# Patient Record
Sex: Female | Born: 1965 | Race: Black or African American | Hispanic: No | Smoking: Former smoker
Health system: Southern US, Community
[De-identification: ages and names within clinical notes are randomized; demographics above are authoritative.]

## PROBLEM LIST (undated history)

## (undated) ENCOUNTER — Emergency Department: Admission: EM | Payer: BC Managed Care – PPO | Source: Home / Self Care

## (undated) DIAGNOSIS — E7800 Pure hypercholesterolemia, unspecified: Secondary | ICD-10-CM

## (undated) DIAGNOSIS — I10 Essential (primary) hypertension: Secondary | ICD-10-CM

---

## 1988-10-23 HISTORY — PX: TUBAL LIGATION: SHX77

## 1998-10-23 HISTORY — PX: KNEE SURGERY: SHX244

## 2001-10-05 ENCOUNTER — Emergency Department (HOSPITAL_COMMUNITY): Admission: EM | Admit: 2001-10-05 | Discharge: 2001-10-05 | Payer: Self-pay | Admitting: *Deleted

## 2002-12-17 ENCOUNTER — Emergency Department (HOSPITAL_COMMUNITY): Admission: EM | Admit: 2002-12-17 | Discharge: 2002-12-17 | Payer: Self-pay | Admitting: Emergency Medicine

## 2004-05-28 ENCOUNTER — Other Ambulatory Visit: Payer: Self-pay

## 2005-08-11 ENCOUNTER — Ambulatory Visit: Payer: Self-pay | Admitting: Family Medicine

## 2005-08-24 ENCOUNTER — Ambulatory Visit: Payer: Self-pay | Admitting: Family Medicine

## 2007-02-11 IMAGING — MG UNKNOWN MG STUDY
1 series · 4 of 4 positions shown · non-contrast
Comparison: none

REASON FOR EXAM: Screening mammography
COMMENTS:

[R MLO · right · 4 of 4 slices shown]
[im 1/4]
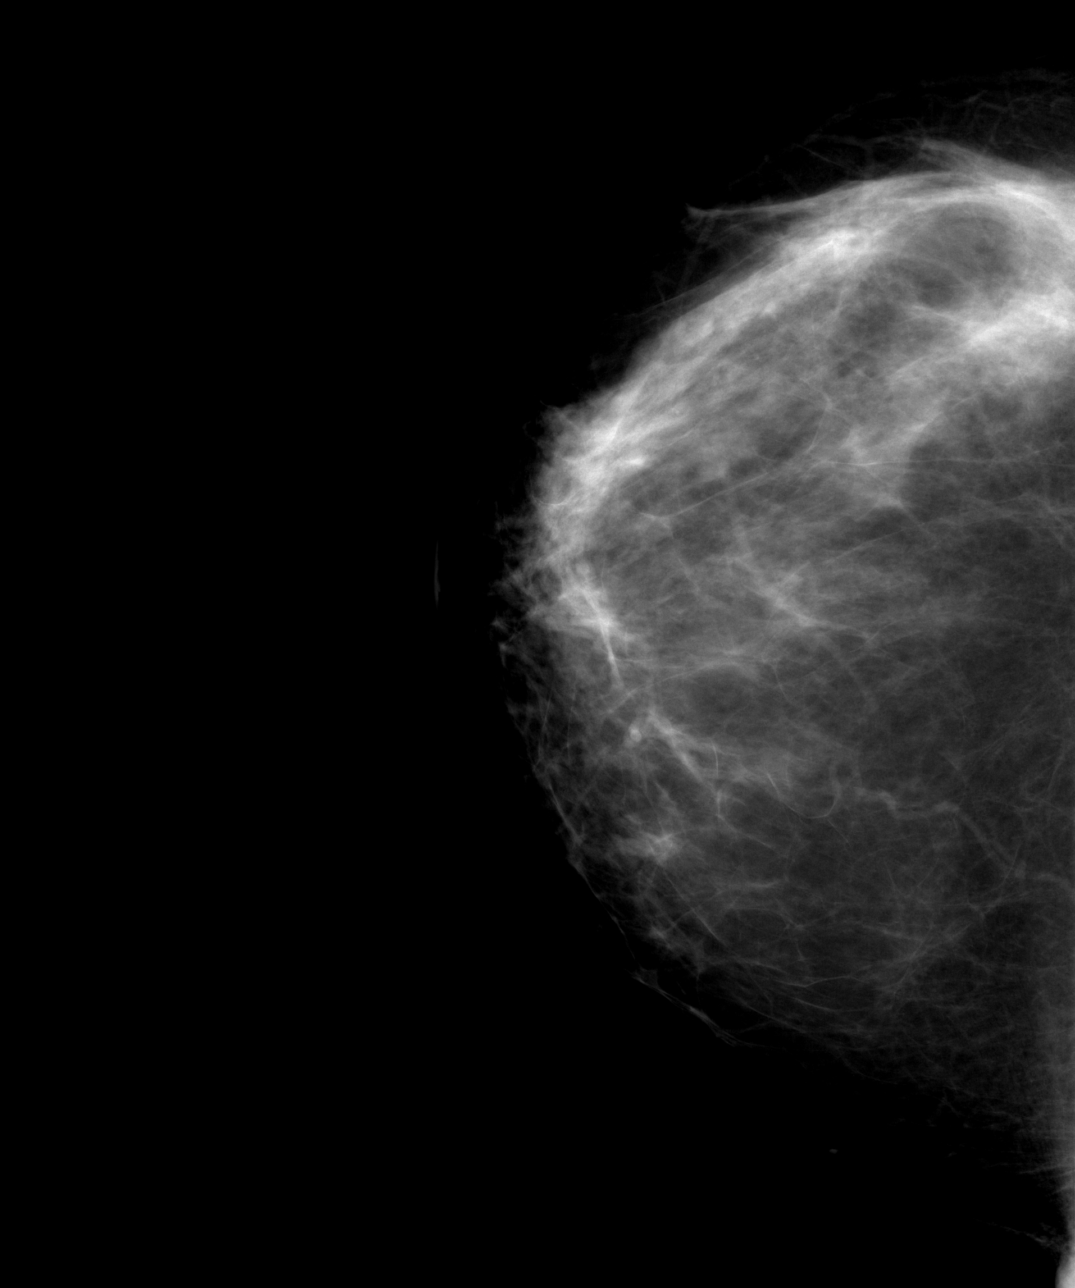
[im 2/4]
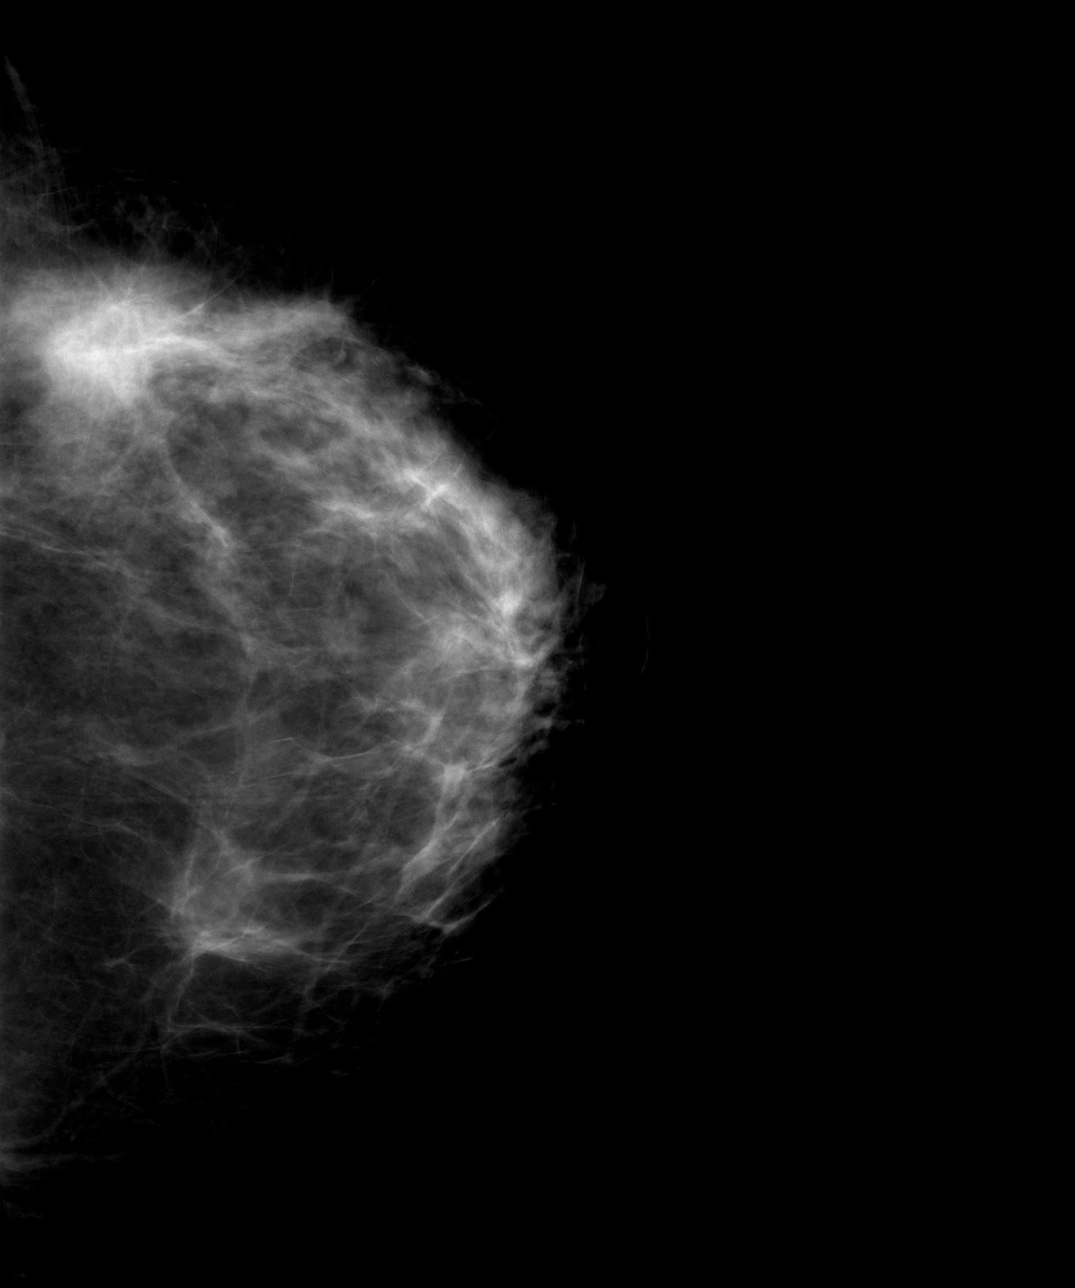
[im 3/4]
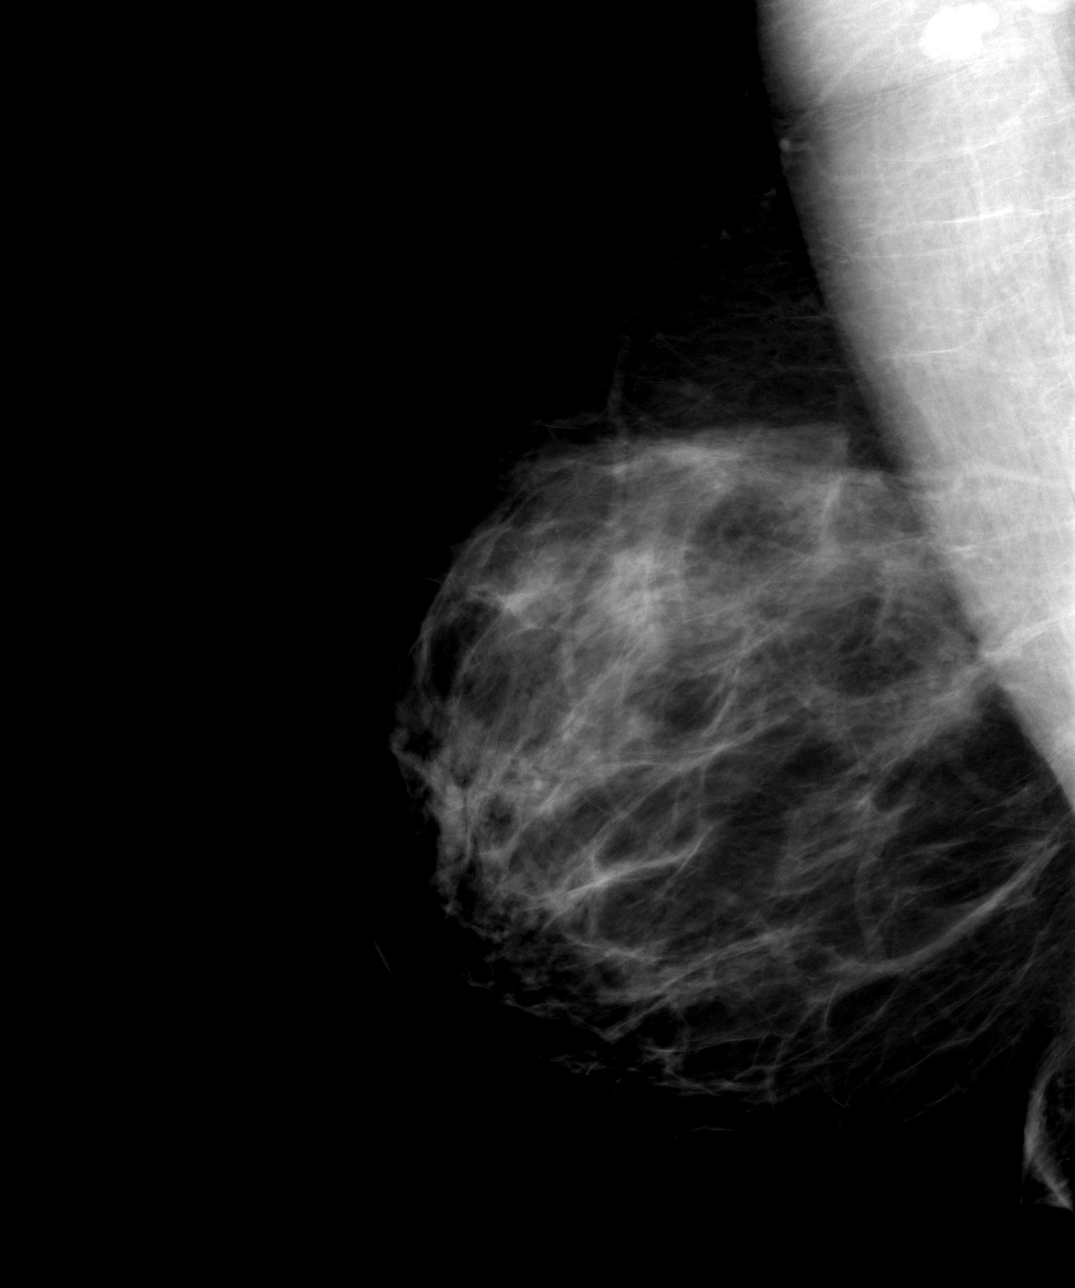
[im 4/4]
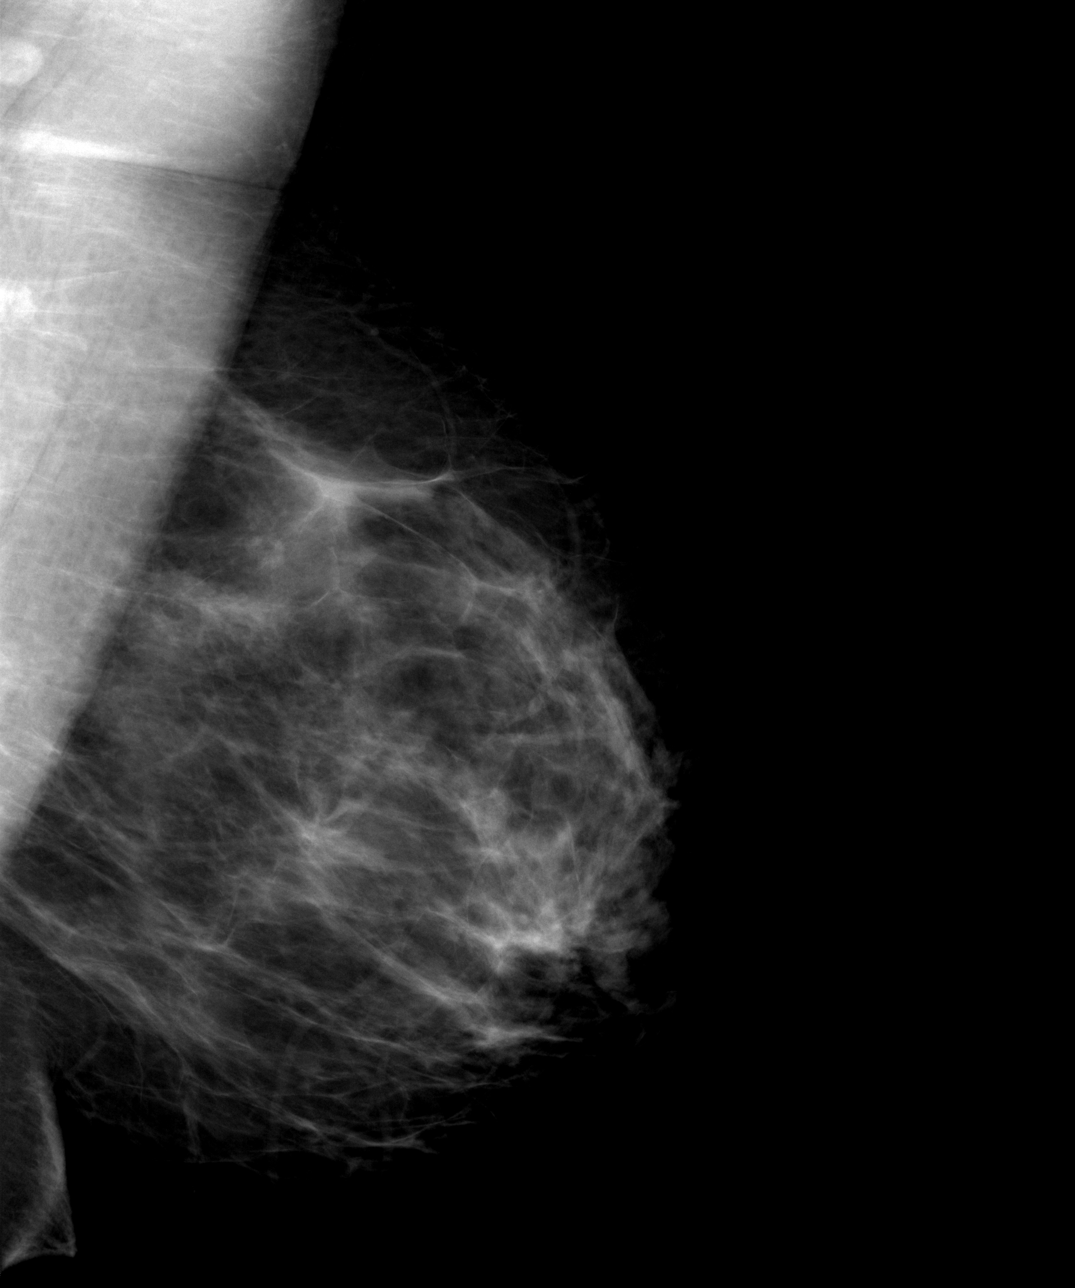

[4 of 4 positions shown; findings below may reference images not displayed]

PROCEDURE:     MAM - MAM DGTL SCREENING MAMMO W/CAD  - August 11, 2005  [DATE]

RESULT:        Comparison is made to prior study dated  02/12/04.

The breasts demonstrate a heterogenous fibronodular parenchymal pattern.  An
area of asymmetric density projects within the superior lateral portion of
the LEFT breast.  Further evaluation with magnification compression imaging
is recommended.  No further radiographic evidence to suggest malignancy is
appreciated.
IMPRESSION: 1.     Asymmetric density, LEFT breast.  Additional imaging recommended.
2.     BI-RADS:  Category 0- Needs Additional Imaging Evaluation.

Thank you for this opportunity to contribute to the care of your patient.

A NEGATIVE MAMMOGRAM REPORT DOES NOT PRECLUDE BIOPSY OR OTHER EVALUATION OF
A CLINICALLY PALPABLE OR OTHERWISE SUSPICIOUS MASS OR LESION.   BREAST

## 2007-06-04 ENCOUNTER — Ambulatory Visit: Payer: Self-pay | Admitting: Family Medicine

## 2008-06-16 ENCOUNTER — Ambulatory Visit: Payer: Self-pay

## 2013-09-08 ENCOUNTER — Encounter (HOSPITAL_COMMUNITY): Payer: Self-pay | Admitting: Emergency Medicine

## 2013-09-08 ENCOUNTER — Emergency Department (HOSPITAL_COMMUNITY)
Admission: EM | Admit: 2013-09-08 | Discharge: 2013-09-08 | Disposition: A | Discharge status: Home / Self Care | Payer: BC Managed Care – PPO | Attending: Emergency Medicine | Admitting: Emergency Medicine

## 2013-09-08 DIAGNOSIS — R112 Nausea with vomiting, unspecified: Secondary | ICD-10-CM | POA: Insufficient documentation

## 2013-09-08 DIAGNOSIS — K209 Esophagitis, unspecified without bleeding: Secondary | ICD-10-CM | POA: Insufficient documentation

## 2013-09-08 DIAGNOSIS — R197 Diarrhea, unspecified: Secondary | ICD-10-CM | POA: Insufficient documentation

## 2013-09-08 DIAGNOSIS — R358 Other polyuria: Secondary | ICD-10-CM | POA: Insufficient documentation

## 2013-09-08 DIAGNOSIS — Z87891 Personal history of nicotine dependence: Secondary | ICD-10-CM | POA: Insufficient documentation

## 2013-09-08 DIAGNOSIS — R350 Frequency of micturition: Secondary | ICD-10-CM | POA: Insufficient documentation

## 2013-09-08 DIAGNOSIS — R3589 Other polyuria: Secondary | ICD-10-CM | POA: Insufficient documentation

## 2013-09-08 DIAGNOSIS — Z3202 Encounter for pregnancy test, result negative: Secondary | ICD-10-CM | POA: Insufficient documentation

## 2013-09-08 DIAGNOSIS — Z79899 Other long term (current) drug therapy: Secondary | ICD-10-CM | POA: Insufficient documentation

## 2013-09-08 DIAGNOSIS — K219 Gastro-esophageal reflux disease without esophagitis: Secondary | ICD-10-CM | POA: Insufficient documentation

## 2013-09-08 LAB — URINALYSIS, ROUTINE W REFLEX MICROSCOPIC
Glucose, UA: NEGATIVE mg/dL
Hgb urine dipstick: NEGATIVE
Nitrite: NEGATIVE
Protein, ur: NEGATIVE mg/dL
Specific Gravity, Urine: >1.03 — ABNORMAL HIGH (ref 1.005–1.030)
Urobilinogen, UA: 0.2 mg/dL (ref 0.0–1.0)
pH: 5.5 (ref 5.0–8.0)

## 2013-09-08 LAB — COMPREHENSIVE METABOLIC PANEL
ALT: 13 U/L (ref 0–35)
Alkaline Phosphatase: 61 U/L (ref 39–117)
CO2: 27 mEq/L (ref 19–32)
GFR calc Af Amer: 79 mL/min — ABNORMAL LOW (ref >90)
Glucose, Bld: 109 mg/dL — ABNORMAL HIGH (ref 70–99)
Potassium: 4 mEq/L (ref 3.5–5.1)
Sodium: 134 mEq/L — ABNORMAL LOW (ref 135–145)
Total Protein: 7.2 g/dL (ref 6.0–8.3)

## 2013-09-08 LAB — URINE MICROSCOPIC-ADD ON

## 2013-09-08 LAB — CBC
HCT: 38.1 % (ref 36.0–46.0)
MCHC: 34.4 g/dL (ref 30.0–36.0)
Platelets: 250 10*3/uL (ref 150–400)
RDW: 13.1 % (ref 11.5–15.5)

## 2013-09-08 LAB — POCT PREGNANCY, URINE: Preg Test, Ur: NEGATIVE

## 2013-09-08 MED ORDER — ONDANSETRON 4 MG PO TBDP: 4.0000 mg | ORAL_TABLET | Freq: Three times a day (TID) | ORAL | Status: DC | PRN

## 2013-09-08 MED ORDER — GI COCKTAIL ~~LOC~~
30.0000 mL | Freq: Once | ORAL | Status: AC
  Administered 2013-09-08: 30 mL via ORAL
  Filled 2013-09-08: qty 30

## 2013-09-08 MED ORDER — METRONIDAZOLE 500 MG PO TABS: 2000.0000 mg | ORAL_TABLET | Freq: Once | ORAL | Status: DC

## 2013-09-08 MED ORDER — OMEPRAZOLE 20 MG PO CPDR: 20.0000 mg | DELAYED_RELEASE_CAPSULE | Freq: Two times a day (BID) | ORAL | Status: DC

## 2013-09-08 NOTE — ED Notes (Signed)
Pt states upper abdominal pain with diarrhea began last night. Pt states she is coughing up thick phlegm, clear and watery at times, per pt.

## 2013-09-08 NOTE — ED Provider Notes (Signed)
CSN: [id number]     Arrival date & time 09/08/13  1610 History   First MD Initiated Contact with Patient 09/08/13 2015     Chief Complaint  Patient presents with  . Abdominal Pain  . Diarrhea    HPI  Patient presents for 24 hours of symptoms with nausea and some diarrhea last night. She was retching and vomited several times. She was sore and her epigastrium. This was a bit sore this morning as well. No additional diarrhea. When she eats today he passes but she states it makes her comfortable and points to her low substernal area as the area of discomfort. No more regurgitation. She did eat a sausage chicken biscuit today  History reviewed. No pertinent past medical history. Past Surgical History  Procedure Laterality Date  . Knee surgery    . Tubal ligaton     History reviewed. No pertinent family history. History  Substance Use Topics  . Smoking status: Former Games developer  . Smokeless tobacco: Not on file  . Alcohol Use: Yes     Comment: Occ   OB History   Grav Para Term Preterm Abortions TAB SAB Ect Mult Living                 Review of Systems  Constitutional: Negative for fever, chills, diaphoresis, appetite change and fatigue.  HENT: Negative for mouth sores, sore throat and trouble swallowing.   Eyes: Negative for visual disturbance.  Respiratory: Negative for cough, chest tightness, shortness of breath and wheezing.   Cardiovascular: Negative for chest pain.  Gastrointestinal: Positive for nausea, vomiting and abdominal pain. Negative for diarrhea and abdominal distention.  Endocrine: Positive for polyuria. Negative for polydipsia and polyphagia.  Genitourinary: Positive for frequency. Negative for dysuria, hematuria and vaginal discharge.  Musculoskeletal: Negative for gait problem.  Skin: Negative for color change, pallor and rash.  Neurological: Negative for dizziness, syncope, light-headedness and headaches.  Hematological: Does not bruise/bleed easily.   Psychiatric/Behavioral: Negative for behavioral problems and confusion.    Allergies  Review of patient's allergies indicates no known allergies.  Home Medications   Current Outpatient Rx  Name  Route  Sig  Dispense  Refill  . metroNIDAZOLE (FLAGYL) 500 MG tablet   Oral   Take 4 tablets (2,000 mg total) by mouth once.   4 tablet   0   . omeprazole (PRILOSEC) 20 MG capsule   Oral   Take 1 capsule (20 mg total) by mouth 2 (two) times daily.   60 capsule   0   . ondansetron (ZOFRAN ODT) 4 MG disintegrating tablet   Oral   Take 1 tablet (4 mg total) by mouth every 8 (eight) hours as needed for nausea.   20 tablet   0    BP 146/82  Pulse 79  Temp(Src) 97.7 F (36.5 C) (Oral)  Resp 18  Ht 5\' 10"  (1.778 m)  Wt 250 lb (113.399 kg)  BMI 35.87 kg/m2  SpO2 97%  LMP 08/21/2013 Physical Exam  Constitutional: She is oriented to person, place, and time. She appears well-developed and well-nourished. No distress.  HENT:  Head: Normocephalic.  Eyes: Conjunctivae are normal. Pupils are equal, round, and reactive to light. No scleral icterus.  Neck: Normal range of motion. Neck supple. No thyromegaly present.  Cardiovascular: Normal rate and regular rhythm.  Exam reveals no gallop and no friction rub.   No murmur heard. Pulmonary/Chest: Effort normal and breath sounds normal. No respiratory distress. She has no wheezes. She  has no rales.  Lungs clear. No abnormal breath sounds.  Abdominal: Soft. Bowel sounds are normal. She exhibits no distension. There is no tenderness. There is no rebound.  Tender in the midline epigastrium. No right or left upper quadrant pain. No peritoneal irritation.  Musculoskeletal: Normal range of motion.  Neurological: She is alert and oriented to person, place, and time.  Skin: Skin is warm and dry. No rash noted.  Psychiatric: She has a normal mood and affect. Her behavior is normal.    ED Course  Procedures (including critical care time) Labs  Review Labs Reviewed  URINALYSIS, ROUTINE W REFLEX MICROSCOPIC - Abnormal; Notable for the following:    Specific Gravity, Urine >1.030 (*)    Leukocytes, UA MODERATE (*)    All other components within normal limits  COMPREHENSIVE METABOLIC PANEL - Abnormal; Notable for the following:    Sodium 134 (*)    Glucose, Bld 109 (*)    Albumin 3.4 (*)    GFR calc non Af Amer 68 (*)    GFR calc Af Amer 79 (*)    All other components within normal limits  URINE MICROSCOPIC-ADD ON - Abnormal; Notable for the following:    Squamous Epithelial / LPF MANY (*)    Bacteria, UA MANY (*)    All other components within normal limits  URINE CULTURE  CBC  TROPONIN I  POCT PREGNANCY, URINE   Imaging Review No results found.  EKG Interpretation    Date/Time:  Monday September 08 2013 20:36:15 EST Ventricular Rate:  86 PR Interval:  164 QRS Duration: 78 QT Interval:  362 QTC Calculation: 433 R Axis:   50 Text Interpretation:  Normal sinus rhythm Normal ECG No previous ECGs available            MDM   1. Esophagitis   2. GERD (gastroesophageal reflux disease)    She is able to swallow. I feel confident she is esophageal impaction. This likely is esophagitis or vomiting and retching yesterday. She has a lot of cells in her urine has minimally symptomatic. She does have frequency. Also has Trichomonas. Plan is treatment for Trichomonas await urine culture. Also we will treat her with Pepcid. Zofran recheck with any worsening symptoms. Normal EKG normal troponin less not an acute coronary syndrome. No impaction. She is able to swallow without regurgitation. Plan symptomatic treatment. Proton pump inhibitors.    Roney Marion, MD 09/08/13 [phone]

## 2013-09-08 NOTE — ED Notes (Signed)
Family at bedside. Patient states that she has had some relief form the gagging feeling she had earlier.

## 2013-09-10 LAB — URINE CULTURE: Colony Count: 40000

## 2015-07-22 ENCOUNTER — Encounter: Payer: Self-pay | Admitting: *Deleted

## 2015-08-04 ENCOUNTER — Encounter: Payer: Self-pay | Admitting: General Surgery

## 2015-08-04 ENCOUNTER — Ambulatory Visit (INDEPENDENT_AMBULATORY_CARE_PROVIDER_SITE_OTHER): Payer: BC Managed Care – PPO | Admitting: General Surgery

## 2015-08-04 VITALS — BP 124/66 | HR 72 | Resp 12 | Ht 70.0 in | Wt 263.0 lb

## 2015-08-04 DIAGNOSIS — Z1211 Encounter for screening for malignant neoplasm of colon: Secondary | ICD-10-CM | POA: Diagnosis not present

## 2015-08-04 MED ORDER — POLYETHYLENE GLYCOL 3350 17 GM/SCOOP PO POWD: ORAL | Status: DC

## 2015-08-04 NOTE — Patient Instructions (Addendum)
Colonoscopy A colonoscopy is an exam to look at the entire large intestine (colon). This exam can help find problems such as tumors, polyps, inflammation, and areas of bleeding. The exam takes about 1 hour.  [facility] KNOW ABOUT:   Any allergies you have.  All medicines you are taking, including vitamins, herbs, eye drops, creams, and over-the-counter medicines.  Previous problems you or members of your family have had with the use of anesthetics.  Any blood disorders you have.  Previous surgeries you have had.  Medical conditions you have. RISKS AND COMPLICATIONS  Generally, this is a safe procedure. However, as with any procedure, complications can occur. Possible complications include:  Bleeding.  Tearing or rupture of the colon wall.  Reaction to medicines given during the exam.  Infection (rare). BEFORE THE PROCEDURE   Ask your health care provider about changing or stopping your regular medicines.  You may be prescribed an oral bowel prep. This involves drinking a large amount of medicated liquid, starting the day before your procedure. The liquid will cause you to have multiple loose stools until your stool is almost clear or light green. This cleans out your colon in preparation for the procedure.  Do not eat or drink anything else once you have started the bowel prep, unless your health care provider tells you it is safe to do so.  Arrange for someone to drive you home after the procedure. PROCEDURE   You will be given medicine to help you relax (sedative).  You will lie on your side with your knees bent.  A long, flexible tube with a light and camera on the end (colonoscope) will be inserted through the rectum and into the colon. The camera sends video back to a computer screen as it moves through the colon. The colonoscope also releases carbon dioxide gas to inflate the colon. This helps your health care provider see the area better.  During  the exam, your health care provider may take a small tissue sample (biopsy) to be examined under a microscope if any abnormalities are found.  The exam is finished when the entire colon has been viewed. AFTER THE PROCEDURE   Do not drive for 24 hours after the exam.  You may have a small amount of blood in your stool.  You may pass moderate amounts of gas and have mild abdominal cramping or bloating. This is caused by the gas used to inflate your colon during the exam.  Ask when your test results will be ready and how you will get your results. Make sure you get your test results.   This information is not intended to replace advice given to you by your health care provider. Make sure you discuss any questions you have with your health care provider.   Document Released: 10/06/2000 Document Revised: 07/30/2013 Document Reviewed: 06/16/2013 Elsevier Interactive Patient Education [employer]2016 Elsevier Inc.  Patient [facility].

## 2015-08-04 NOTE — Progress Notes (Signed)
Patient ID: Sandra Garrett, female   DOB: Jun 16, 1966, 49 y.o.   MRN: [id number]  Chief Complaint  Patient presents with  . Colonoscopy    HPI Sandra Garrett is a 49 y.o. female here for an evaluation for colonoscopy. She has not had one prior. Patient states no GI Issues. She moves her bowels every 2 or 3 days. 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 She has 2 children one boy and one girl.  HPI  No past medical history on file.  Past Surgical History  Procedure Laterality Date  . Knee surgery  2000  . Tubal ligation  1990    No family history on file.  Social History Social History  Substance Use Topics  . Smoking status: Former Smoker    Quit date: 10/23/1978  . Smokeless tobacco: None  . Alcohol Use: 0.6 - 1.2 oz/week    1-2 Standard drinks or equivalent per week     Comment: Occ    No Known Allergies  Current Outpatient Prescriptions  Medication Sig Dispense Refill  . Acetaminophen-Caffeine (EXCEDRIN TENSION HEADACHE) 500-65 MG TABS Take by mouth as needed.    . polyethylene glycol powder (GLYCOLAX/MIRALAX) powder 255 grams one bottle for colonoscopy prep 255 g 0   No current facility-administered medications for this visit.    Review of Systems Review of Systems  Constitutional: Negative.   Respiratory: Negative.   Cardiovascular: Negative.   Gastrointestinal: Negative.     Blood pressure 124/66, pulse 72, resp. rate 12, height 5\' 10"  (1.778 m), weight 263 lb (119.296 kg), last menstrual period 07/27/2015.  Physical Exam Physical Exam  Constitutional: She is oriented to person, place, and time. She appears well-developed and well-nourished.  Eyes: Conjunctivae are normal. No scleral icterus.  Cardiovascular: Normal rate, regular rhythm and normal heart sounds.   Pulmonary/Chest: Effort normal and breath sounds normal.  Neurological: She is alert and oriented to person, place, and time.  Skin: Skin is warm and dry.    Data  Reviewed OB/GYN node of 07/22/2015. Fecal occult blood was reported as negative.  Assessment    Candidate for screening colonoscopy.    Plan         Colonoscopy with possible biopsy/polypectomy prn: Information regarding the procedure, including its potential risks and complications (including but not limited to perforation of the bowel, which may require emergency surgery to repair, and bleeding) was verbally given to the patient. Educational information regarding lower intestinal endoscopy was given to the patient. Written instructions for how to complete the bowel prep using Miralax were provided. The importance of drinking ample fluids to avoid dehydration as a result of the prep emphasized.  [facility].   PCP: None Ref: Dr. Arvil ChacoVan Dalen   Earline MayotteByrnett, Jeffrey W 08/05/2015, 7:11 AM

## 2015-08-05 DIAGNOSIS — Z1211 Encounter for screening for malignant neoplasm of colon: Secondary | ICD-10-CM | POA: Insufficient documentation

## 2015-08-05 NOTE — H&P (Signed)
HPI  Sandra Garrett is a 49 y.o. female here for an evaluation for colonoscopy. She has not had one prior. Patient states no GI Issues. She moves her bowels every 2 or 3 days. 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 She has 2 children one boy and one girl.  HPI  No past medical history on file.  Past Surgical History   Procedure  Laterality  Date   .  Knee surgery   2000   .  Tubal ligation   1990    No family history on file.  Social History  Social History   Substance Use Topics   .  Smoking status:  Former Smoker     Quit date:  10/23/1978   .  Smokeless tobacco:  None   .  Alcohol Use:  0.6 - 1.2 oz/week     1-2 Standard drinks or equivalent per week      Comment: Occ    No Known Allergies  Current Outpatient Prescriptions   Medication  Sig  Dispense  Refill   .  Acetaminophen-Caffeine (EXCEDRIN TENSION HEADACHE) 500-65 MG TABS  Take by mouth as needed.     .  polyethylene glycol powder (GLYCOLAX/MIRALAX) powder  255 grams one bottle for colonoscopy prep  255 g  0    No current facility-administered medications for this visit.    Review of Systems  Review of Systems  Constitutional: Negative.  Respiratory: Negative.  Cardiovascular: Negative.  Gastrointestinal: Negative.   Blood pressure 124/66, pulse 72, resp. rate 12, height 5\' 10"  (1.778 m), weight 263 lb (119.296 kg), last menstrual period 07/27/2015.  Physical Exam  Physical Exam  Constitutional: She is oriented to person, place, and time. She appears well-developed and well-nourished.  Eyes: Conjunctivae are normal. No scleral icterus.  Cardiovascular: Normal rate, regular rhythm and normal heart sounds.  Pulmonary/Chest: Effort normal and breath sounds normal.  Neurological: She is alert and oriented to person, place, and time.  Skin: Skin is warm and dry.   Data Reviewed  OB/GYN node of 07/22/2015. Fecal occult blood was reported as negative.  Assessment   Candidate for screening  colonoscopy.   Plan    Colonoscopy with possible biopsy/polypectomy prn: Information regarding the procedure, including its potential risks and complications (including but not limited to perforation of the bowel, which may require emergency surgery to repair, and bleeding) was verbally given to the patient. Educational information regarding lower intestinal endoscopy was given to the patient. Written instructions for how to complete the bowel prep using Miralax were provided. The importance of drinking ample fluids to avoid dehydration as a result of the prep emphasized.  Patient has been [facility].  PCP: None  Ref: Dr. Arvil ChacoVan Dalen  Earline MayotteByrnett, Roe Wilner W  08/05/2015, 7:11 AM

## 2015-08-17 ENCOUNTER — Ambulatory Visit
Admission: RE | Admit: 2015-08-17 | Discharge: 2015-08-17 | Disposition: A | Discharge status: Home / Self Care | Payer: BC Managed Care – PPO | Source: Ambulatory Visit | Attending: General Surgery | Admitting: General Surgery

## 2015-08-17 ENCOUNTER — Encounter
Admission: RE | Disposition: A | Discharge status: Home / Self Care | Payer: Self-pay | Source: Ambulatory Visit | Attending: General Surgery

## 2015-08-17 ENCOUNTER — Encounter: Payer: Self-pay | Admitting: Anesthesiology

## 2015-08-17 ENCOUNTER — Ambulatory Visit: Payer: BC Managed Care – PPO | Admitting: Anesthesiology

## 2015-08-17 DIAGNOSIS — Z1211 Encounter for screening for malignant neoplasm of colon: Secondary | ICD-10-CM | POA: Diagnosis not present

## 2015-08-17 DIAGNOSIS — K621 Rectal polyp: Secondary | ICD-10-CM | POA: Diagnosis not present

## 2015-08-17 DIAGNOSIS — Z87891 Personal history of nicotine dependence: Secondary | ICD-10-CM | POA: Diagnosis not present

## 2015-08-17 DIAGNOSIS — E119 Type 2 diabetes mellitus without complications: Secondary | ICD-10-CM | POA: Diagnosis not present

## 2015-08-17 HISTORY — PX: COLONOSCOPY WITH PROPOFOL: SHX5780

## 2015-08-17 LAB — POCT PREGNANCY, URINE: Preg Test, Ur: NEGATIVE

## 2015-08-17 SURGERY — COLONOSCOPY WITH PROPOFOL
Anesthesia: General

## 2015-08-17 MED ORDER — SODIUM CHLORIDE 0.9 % IV SOLN
INTRAVENOUS | Status: DC
  Administered 2015-08-17: 14:00:00 via INTRAVENOUS

## 2015-08-17 MED ORDER — SODIUM CHLORIDE 0.9 % IV SOLN
INTRAVENOUS | Status: DC | PRN
  Administered 2015-08-17: 14:00:00 via INTRAVENOUS

## 2015-08-17 MED ORDER — MIDAZOLAM HCL 2 MG/2ML IJ SOLN
INTRAMUSCULAR | Status: DC | PRN
  Administered 2015-08-17: 1 mg via INTRAVENOUS

## 2015-08-17 MED ORDER — PROPOFOL 500 MG/50ML IV EMUL
INTRAVENOUS | Status: DC | PRN
  Administered 2015-08-17: 100 ug/kg/min via INTRAVENOUS

## 2015-08-17 MED ORDER — FENTANYL CITRATE (PF) 100 MCG/2ML IJ SOLN
INTRAMUSCULAR | Status: DC | PRN
  Administered 2015-08-17: 50 ug via INTRAVENOUS

## 2015-08-17 MED ORDER — SODIUM CHLORIDE 0.9 % IV SOLN: INTRAVENOUS | Status: DC

## 2015-08-17 NOTE — Anesthesia Postprocedure Evaluation (Signed)
  Anesthesia Post-op Note  Patient: Sandra Garrett  Procedure(s) Performed: Procedure(s): COLONOSCOPY WITH PROPOFOL (N/A)  Anesthesia type:General  Patient location: PACU  Post pain: Pain level controlled  Post assessment: Post-op Vital signs reviewed, Patient's Cardiovascular Status Stable, Respiratory Function Stable, Patent Airway and No signs of Nausea or vomiting  Post vital signs: Reviewed and stable  Last Vitals:  Filed Vitals:   08/17/15 1450  BP: 127/82  Pulse: 68  Temp:   Resp: 10    Level of consciousness: awake, alert  and patient cooperative  Complications: No apparent anesthesia complications

## 2015-08-17 NOTE — Transfer of Care (Signed)
Immediate Anesthesia Transfer of Care Note  Patient: Sandra Garrett  Procedure(s) Performed: Procedure(s): COLONOSCOPY WITH PROPOFOL (N/A)  Patient Location: PACU  Anesthesia Type:General  Level of Consciousness: awake and alert   Airway & Oxygen Therapy: Patient Spontanous Breathing  Post-op Assessment: Report given to RN  Post vital signs: stable  Last Vitals:  Filed Vitals:   08/17/15 1417  BP: 112/66  Pulse: 77  Temp: 37 C  Resp: 20    Complications: No apparent anesthesia complications

## 2015-08-17 NOTE — Op Note (Signed)
[facility]enter[facility] Patient Name: Sandra Garrett Procedure Date: 08/17/2015 1:54 PM MRN: [id number] Account #: [id number] Date of Birth: 08-26-1966 Admit Type: Outpatient Age: 49 [facility] ROOM 4 Gender: Female Note Status: Finalized Procedure:         Colonoscopy Indications:       Screening for colorectal malignant neoplasm Providers:         Earline MayotteJeffrey W. Nicey Krah, MD Referring MD:      Rona Ravensobert W. Van Dalen, MD (Referring MD) Medicines:         Monitored Anesthesia Care Complications:     No immediate complications. Procedure:         Pre-Anesthesia Assessment:                    - The risks and benefits of the procedure and the sedation                     options and risks were discussed with the patient. All                     questions were answered and informed consent was obtained.                    - Prior to the procedure, a History and Physical was                     performed, and patient medications, allergies and                     sensitivities were reviewed. The patient's tolerance of                     previous anesthesia was reviewed.                    After obtaining informed consent, the colonoscope was                     passed under direct vision. Throughout the procedure, the                     patient's blood pressure, pulse, and oxygen saturations                     were monitored continuously. The Colonoscope was                     introduced through the anus and advanced to the the cecum,                     identified by appendiceal orifice and ileocecal valve. The                     colonoscopy was performed without difficulty. The patient                     tolerated the procedure well. The quality of the bowel                     preparation was excellent. Findings:      A 5 mm polyp was found in the rectum. The polyp was sessile. This was       biopsied with a cold forceps for histology.      The  retroflexed view of the  distal rectum and anal verge was normal and       showed no anal or rectal abnormalities. Impression:        - One 5 mm polyp in the rectum. Biopsied.                    - The distal rectum and anal verge are normal on                     retroflexion view. Recommendation:    - Telephone endoscopist for pathology results in 1 week. Procedure Code(s): --- Professional ---                    [id number], Colonoscopy, flexible; with biopsy, single or                     multiple Diagnosis Code(s): --- Professional ---                    Z12.11, Encounter for screening for malignant neoplasm of                     colon                    K62.1, Rectal polyp CPT copyright 2014 American Medical Association. All rights reserved. The codes documented in this report are preliminary and upon coder review may  be revised to meet current compliance requirements. Earline Mayotte, MD 08/17/2015 2:16:18 PM This report has been signed electronically. Number of Addenda: 0 Note Initiated On: 08/17/2015 1:54 PM Scope Withdrawal Time: 0 hours 8 minutes 44 seconds  Total Procedure Duration: 0 hours 14 minutes 24 seconds       [facility]

## 2015-08-17 NOTE — Anesthesia Preprocedure Evaluation (Signed)
Anesthesia Evaluation  Patient identified by MRN, date of birth, ID band Patient awake    Reviewed: Allergy & Precautions, H&P , NPO status , Patient's Chart, lab work & pertinent test results  History of Anesthesia Complications Negative for: history of anesthetic complications  Airway Mallampati: II  TM Distance: >3 FB Neck ROM: full    Dental  (+) Poor Dentition, Chipped, Caps   Pulmonary neg COPD, former smoker,    Pulmonary exam normal breath sounds clear to auscultation       Cardiovascular Exercise Tolerance: Good (-) angina(-) Past MI and (-) DOE negative cardio ROS Normal cardiovascular exam Rhythm:regular Rate:Normal     Neuro/Psych negative neurological ROS  negative psych ROS   GI/Hepatic negative GI ROS, Neg liver ROS,   Endo/Other  diabetes  Renal/GU negative Renal ROS  negative genitourinary   Musculoskeletal negative musculoskeletal ROS (+)   Abdominal   Peds negative pediatric ROS (+)  Hematology negative hematology ROS (+)   Anesthesia Other Findings History reviewed. No pertinent past medical history.  Past Surgical History:   KNEE SURGERY                                     2000         TUBAL LIGATION                                   1990        BMI    Body Mass Index   37.73 kg/m 2      Reproductive/Obstetrics negative OB ROS                             Anesthesia Physical Anesthesia Plan  ASA: III  Anesthesia Plan: General   Post-op Pain Management:    Induction:   Airway Management Planned:   Additional Equipment:   Intra-op Plan:   Post-operative Plan:   Informed Consent: I have reviewed the patients History and Physical, chart, labs and discussed the procedure including the risks, benefits and alternatives for the proposed anesthesia with the patient or authorized representative who has indicated his/her understanding and acceptance.    Dental Advisory Given  Plan Discussed with: Anesthesiologist, CRNA and Surgeon  Anesthesia Plan Comments:         Anesthesia Quick Evaluation

## 2015-08-18 NOTE — H&P (Signed)
Sandra Garrett [id number] 11-26-1965     HPI: Candidate for screening colonoscopy  No prescriptions prior to admission   No Known Allergies History reviewed. No pertinent past medical history. Past Surgical History  Procedure Laterality Date  . Knee surgery  2000  . Tubal ligation  1990   Social History   Social History  . Marital Status: Married    Spouse Name: N/A  . Number of Children: N/A  . Years of Education: N/A   Occupational History  . Not on file.   Social History Main Topics  . Smoking status: Former Smoker    Quit date: 10/23/1978  . Smokeless tobacco: Not on file  . Alcohol Use: 0.6 - 1.2 oz/week    1-2 Standard drinks or equivalent per week     Comment: Occ  . Drug Use: No  . Sexual Activity: Not on file   Other Topics Concern  . Not on file   Social History Narrative   Social History   Social History Narrative     ROS: Negative.     PE: HEENT: Negative. Lungs: Clear. Cardio: RR. Earline MayotteByrnett, Jeffrey W 08/18/2015   Assessment/Plan:  Proceed with planned endoscopy.

## 2015-08-19 ENCOUNTER — Encounter: Payer: Self-pay | Admitting: General Surgery

## 2015-08-19 LAB — SURGICAL PATHOLOGY

## 2015-11-10 ENCOUNTER — Emergency Department
Admission: EM | Admit: 2015-11-10 | Discharge: 2015-11-11 | Disposition: A | Discharge status: Home / Self Care | Payer: BC Managed Care – PPO | Attending: Emergency Medicine | Admitting: Emergency Medicine

## 2015-11-10 ENCOUNTER — Encounter: Payer: Self-pay | Admitting: *Deleted

## 2015-11-10 DIAGNOSIS — R6883 Chills (without fever): Secondary | ICD-10-CM | POA: Diagnosis not present

## 2015-11-10 DIAGNOSIS — Z87891 Personal history of nicotine dependence: Secondary | ICD-10-CM | POA: Diagnosis not present

## 2015-11-10 DIAGNOSIS — R1110 Vomiting, unspecified: Secondary | ICD-10-CM

## 2015-11-10 DIAGNOSIS — R197 Diarrhea, unspecified: Secondary | ICD-10-CM | POA: Diagnosis not present

## 2015-11-10 DIAGNOSIS — R112 Nausea with vomiting, unspecified: Secondary | ICD-10-CM | POA: Diagnosis not present

## 2015-11-10 LAB — COMPREHENSIVE METABOLIC PANEL
ALBUMIN: 3.8 g/dL (ref 3.5–5.0)
ALK PHOS: 55 U/L (ref 38–126)
ALT: 20 U/L (ref 14–54)
AST: 21 U/L (ref 15–41)
Anion gap: 8 (ref 5–15)
BILIRUBIN TOTAL: 1 mg/dL (ref 0.3–1.2)
BUN: 13 mg/dL (ref 6–20)
CALCIUM: 9 mg/dL (ref 8.9–10.3)
CO2: 24 mmol/L (ref 22–32)
Chloride: 102 mmol/L (ref 101–111)
Creatinine, Ser: 0.87 mg/dL (ref 0.44–1.00)
GFR calc Af Amer: >60 mL/min (ref >60)
GFR calc non Af Amer: >60 mL/min (ref >60)
Glucose, Bld: 132 mg/dL — ABNORMAL HIGH (ref 65–99)
Potassium: 4 mmol/L (ref 3.5–5.1)
Sodium: 134 mmol/L — ABNORMAL LOW (ref 135–145)
TOTAL PROTEIN: 7.6 g/dL (ref 6.5–8.1)

## 2015-11-10 LAB — URINALYSIS COMPLETE WITH MICROSCOPIC (ARMC ONLY)
BACTERIA UA: NONE SEEN
Bilirubin Urine: NEGATIVE
Glucose, UA: NEGATIVE mg/dL
Hgb urine dipstick: NEGATIVE
KETONES UR: NEGATIVE mg/dL
NITRITE: NEGATIVE
PH: 6 (ref 5.0–8.0)
PROTEIN: NEGATIVE mg/dL
SPECIFIC GRAVITY, URINE: 1.015 (ref 1.005–1.030)

## 2015-11-10 LAB — CBC
HCT: 40.6 % (ref 35.0–47.0)
Hemoglobin: 13.6 g/dL (ref 12.0–16.0)
MCH: 30 pg (ref 26.0–34.0)
MCHC: 33.5 g/dL (ref 32.0–36.0)
MCV: 89.6 fL (ref 80.0–100.0)
Platelets: 217 10*3/uL (ref 150–440)
RBC: 4.54 MIL/uL (ref 3.80–5.20)
RDW: 13.5 % (ref 11.5–14.5)
WBC: 9.2 10*3/uL (ref 3.6–11.0)

## 2015-11-10 MED ORDER — SODIUM CHLORIDE 0.9 % IV BOLUS (SEPSIS)
1000.0000 mL | Freq: Once | INTRAVENOUS | Status: AC
  Administered 2015-11-10: 1000 mL via INTRAVENOUS

## 2015-11-10 MED ORDER — ONDANSETRON 4 MG PO TBDP
4.0000 mg | ORAL_TABLET | Freq: Once | ORAL | Status: AC | PRN
  Administered 2015-11-10: 4 mg via ORAL
  Filled 2015-11-10: qty 1

## 2015-11-10 MED ORDER — SODIUM CHLORIDE 0.9 % IV BOLUS (SEPSIS): 1000.0000 mL | Freq: Once | INTRAVENOUS | Status: DC

## 2015-11-10 MED ORDER — ONDANSETRON HCL 4 MG/2ML IJ SOLN
4.0000 mg | Freq: Once | INTRAMUSCULAR | Status: AC
  Administered 2015-11-10: 4 mg via INTRAVENOUS
  Filled 2015-11-10: qty 2

## 2015-11-10 MED ORDER — LOPERAMIDE HCL 2 MG PO CAPS
4.0000 mg | ORAL_CAPSULE | Freq: Once | ORAL | Status: AC
  Administered 2015-11-10: 4 mg via ORAL
  Filled 2015-11-10: qty 2

## 2015-11-10 NOTE — ED Provider Notes (Signed)
[facility] Emergency Department Provider Note  ____________________________________________  Time seen: 11:20 PM  I have reviewed the triage vital signs and the nursing notes.   HISTORY  Chief Complaint Nausea and Chills      HPI Sandra Garrett is a 50 y.o. female presents with acute onset of vomiting times one and diarrhea accompanied by chills this morning. Patient denies any abdominal pain. Of note the patient states that her granddaughter has similar symptoms who resides with her.     History reviewed. No pertinent past medical history.  Patient Active Problem List   Diagnosis Date Noted  . Encounter for screening colonoscopy 08/05/2015    Past Surgical History  Procedure Laterality Date  . Knee surgery  2000  . Tubal ligation  1990  . Colonoscopy with propofol N/A 08/17/2015    Procedure: COLONOSCOPY WITH PROPOFOL;  Surgeon: Earline Mayotte, MD;  Location: [facility] ENDOSCOPY;  Service: Endoscopy;  Laterality: N/A;    Current Outpatient Rx  Name  Route  Sig  Dispense  Refill  . Acetaminophen-Caffeine (EXCEDRIN TENSION HEADACHE) 500-65 MG TABS   Oral   Take by mouth as needed.           Allergies Review of patient's allergies indicates no known allergies.  History reviewed. No pertinent family history.  Social History Social History  Substance Use Topics  . Smoking status: Former Smoker    Quit date: 10/23/1978  . Smokeless tobacco: None  . Alcohol Use: 0.6 - 1.2 oz/week    1-2 Standard drinks or equivalent per week     Comment: Occ    Review of Systems  Constitutional: Negative for fever. Eyes: Negative for visual changes. ENT: Negative for sore throat. Cardiovascular: Negative for chest pain. Respiratory: Negative for shortness of breath. Gastrointestinal: Positive for vomiting and diarrhea as Genitourinary: Negative for dysuria. Musculoskeletal: Negative for back pain. Skin: Negative for rash. Neurological:  Negative for headaches, focal weakness or numbness.   10-point ROS otherwise negative.  ____________________________________________   PHYSICAL EXAM:  VITAL SIGNS: ED Triage Vitals  Enc Vitals Group     BP 11/10/15 1808 148/95 mmHg     Pulse Rate 11/10/15 1808 103     Resp 11/10/15 1808 18     Temp 11/10/15 1808 100.7 F (38.2 C)     Temp Source 11/10/15 1808 Oral     SpO2 11/10/15 1808 96 %     Weight 11/10/15 1808 251 lb (113.853 kg)     Height 11/10/15 1808  (1.753 m)     Head Cir --      Peak Flow --      Pain Score 11/10/15 2226 3     Pain Loc --      Pain Edu? --      Excl. in GC? --      Constitutional: Alert and oriented. Well appearing and in no distress. Eyes: Conjunctivae are normal. PERRL. Normal extraocular movements. ENT   Head: Normocephalic and atraumatic.   Nose: No congestion/rhinnorhea.   Mouth/Throat: Mucous membranes are moist.   Neck: No stridor. Hematological/Lymphatic/Immunilogical: No cervical lymphadenopathy. Cardiovascular: Normal rate, regular rhythm. Normal and symmetric distal pulses are present in all extremities. No murmurs, rubs, or gallops. Respiratory: Normal respiratory effort without tachypnea nor retractions. Breath sounds are clear and equal bilaterally. No wheezes/rales/rhonchi. Gastrointestinal: Soft and nontender. No distention. There is no CVA tenderness. Genitourinary: deferred Musculoskeletal: Nontender with normal range of motion in all extremities. No joint effusions.  No lower extremity tenderness nor edema. Neurologic:  Normal speech and language. No gross focal neurologic deficits are appreciated. Speech is normal.  Skin:  Skin is warm, dry and intact. No rash noted. Psychiatric: Mood and affect are normal. Speech and behavior are normal. Patient exhibits appropriate insight and judgment.  ____________________________________________    LABS (pertinent positives/negatives)  Labs Reviewed   COMPREHENSIVE METABOLIC PANEL - Abnormal; Notable for the following:    Sodium 134 (*)    Glucose, Bld 132 (*)    All other components within normal limits  URINALYSIS COMPLETEWITH MICROSCOPIC ([facility] ONLY) - Abnormal; Notable for the following:    Color, Urine YELLOW (*)    APPearance HAZY (*)    Leukocytes, UA 1+ (*)    Squamous Epithelial / LPF 6-30 (*)    All other components within normal limits  CBC      INITIAL IMPRESSION / ASSESSMENT AND PLAN / ED COURSE  Pertinent labs & imaging results that were available during my care of the patient were reviewed by me and considered in my medical decision making (see chart for details).  Given no abdominal pain on exam lab data that is unremarkable history of sick contact with same CT scan of the abdomen was not performed. Viral etiology is likely. Patient received IV normal saline as well as IV Zofran with resolution of nausea no further diarrhea  ____________________________________________   FINAL CLINICAL IMPRESSION(S) / ED DIAGNOSES  Final diagnoses:  Vomiting and diarrhea      Darci Current, MD 11/11/15 [phone]

## 2015-11-10 NOTE — ED Notes (Addendum)
Pt states nausea feeling and cold chills, also states loose stool,  started this AM

## 2015-11-11 MED ORDER — ONDANSETRON 4 MG PO TBDP: 4.0000 mg | ORAL_TABLET | Freq: Three times a day (TID) | ORAL | Status: AC | PRN

## 2015-11-11 NOTE — Discharge Instructions (Signed)

## 2016-03-15 ENCOUNTER — Encounter: Payer: Self-pay | Admitting: Emergency Medicine

## 2016-03-15 ENCOUNTER — Emergency Department
Admission: EM | Admit: 2016-03-15 | Discharge: 2016-03-15 | Disposition: A | Discharge status: Home / Self Care | Payer: BC Managed Care – PPO | Attending: Emergency Medicine | Admitting: Emergency Medicine

## 2016-03-15 ENCOUNTER — Emergency Department: Payer: BC Managed Care – PPO

## 2016-03-15 DIAGNOSIS — R0789 Other chest pain: Secondary | ICD-10-CM | POA: Diagnosis not present

## 2016-03-15 DIAGNOSIS — I10 Essential (primary) hypertension: Secondary | ICD-10-CM | POA: Diagnosis not present

## 2016-03-15 DIAGNOSIS — Z87891 Personal history of nicotine dependence: Secondary | ICD-10-CM | POA: Diagnosis not present

## 2016-03-15 DIAGNOSIS — R079 Chest pain, unspecified: Secondary | ICD-10-CM | POA: Diagnosis present

## 2016-03-15 HISTORY — DX: Essential (primary) hypertension: I10

## 2016-03-15 HISTORY — DX: Pure hypercholesterolemia, unspecified: E78.00

## 2016-03-15 LAB — CBC
HEMATOCRIT: 40.4 % (ref 35.0–47.0)
Hemoglobin: 13.6 g/dL (ref 12.0–16.0)
MCH: 30.6 pg (ref 26.0–34.0)
MCHC: 33.7 g/dL (ref 32.0–36.0)
MCV: 90.8 fL (ref 80.0–100.0)
Platelets: 242 10*3/uL (ref 150–440)
RBC: 4.45 MIL/uL (ref 3.80–5.20)
RDW: 13.8 % (ref 11.5–14.5)
WBC: 7.5 10*3/uL (ref 3.6–11.0)

## 2016-03-15 LAB — TROPONIN I: Troponin I: <0.03 ng/mL (ref <0.031)

## 2016-03-15 LAB — BASIC METABOLIC PANEL
Anion gap: 6 (ref 5–15)
BUN: 14 mg/dL (ref 6–20)
CO2: 29 mmol/L (ref 22–32)
Calcium: 9.6 mg/dL (ref 8.9–10.3)
Chloride: 101 mmol/L (ref 101–111)
Creatinine, Ser: 1.05 mg/dL — ABNORMAL HIGH (ref 0.44–1.00)
GFR calc Af Amer: >60 mL/min (ref >60)
GFR calc non Af Amer: >60 mL/min (ref >60)
Glucose, Bld: 120 mg/dL — ABNORMAL HIGH (ref 65–99)
Potassium: 4.2 mmol/L (ref 3.5–5.1)
Sodium: 136 mmol/L (ref 135–145)

## 2016-03-15 MED ORDER — ESOMEPRAZOLE MAGNESIUM 40 MG PO CPDR
40.0000 mg | DELAYED_RELEASE_CAPSULE | Freq: Every day | ORAL | Status: AC
Start: 2016-03-15 — End: 2017-03-15

## 2016-03-15 MED ORDER — GI COCKTAIL ~~LOC~~
30.0000 mL | Freq: Once | ORAL | Status: AC
  Administered 2016-03-15: 30 mL via ORAL
  Filled 2016-03-15: qty 30

## 2016-03-15 NOTE — ED Provider Notes (Signed)
[facility] Provider Note   ____________________________________________  Time seen: Approximately 10:56 AM  I have reviewed the triage vital signs and the nursing notes.   HISTORY  Chief Complaint Chest Pain    HPI Sandra Garrett is a 50 y.o. female patient reports she's had gastroesophageal reflux in the past. She thinks this might be the same. She had spaghetti for supper on Monday and then developed the chest pain that night. She reports the chest pain is worse if she lays down. It's not worse with exercise and is not associated with shortness of breath nausea or sweating. Pain is in the middle of her chest burning type of pain got better after she took some Tums but came back again last night and this morning. Moderate in nature  Past Medical History  Diagnosis Date  . Hypertension   . High cholesterol     Patient Active Problem List   Diagnosis Date Noted  . Encounter for screening colonoscopy 08/05/2015    Past Surgical History  Procedure Laterality Date  . Knee surgery  2000  . Tubal ligation  1990  . Colonoscopy with propofol N/A 08/17/2015    Procedure: COLONOSCOPY WITH PROPOFOL;  Surgeon: Earline MayotteJeffrey W Byrnett, MD;  [facility];  Service: Endoscopy;  Laterality: N/A;    Current Outpatient Rx  Name  Route  Sig  Dispense  Refill  . Acetaminophen-Caffeine (EXCEDRIN TENSION HEADACHE) 500-65 MG TABS   Oral   Take by mouth as needed.         Marland Kitchen. esomeprazole (NEXIUM) 40 MG capsule   Oral   Take 1 capsule (40 mg total) by mouth daily.   30 capsule   1   . ondansetron (ZOFRAN-ODT) 4 MG disintegrating tablet   Oral   Take 1 tablet (4 mg total) by mouth every 8 (eight) hours as needed for nausea.   20 tablet   0     Allergies Review of patient's allergies indicates no known allergies.  No family history on file.  Social History Social History  Substance Use Topics  . Smoking status: Former  Smoker    Quit date: 10/23/1978  . Smokeless tobacco: None  . Alcohol Use: 0.6 - 1.2 oz/week    1-2 Standard drinks or equivalent per week     Comment: Occ    Review of Systems Constitutional: No fever/chills Eyes: No visual changes. ENT: No sore throat. Cardiovascular: See history of present illness Respiratory: Denies shortness of breath. Gastrointestinal: No abdominal pain.  No nausea, no vomiting.  No diarrhea.  No constipation. Genitourinary: Negative for dysuria. Musculoskeletal: Negative for back pain. Skin: Negative for rash. Neurological: Negative for headaches, focal weakness or numbness.  10-point ROS otherwise negative.  ____________________________________________   PHYSICAL EXAM:  VITAL SIGNS: ED Triage Vitals  Enc Vitals Group     BP 03/15/16 0859 139/79 mmHg     Pulse Rate 03/15/16 0859 69     Resp 03/15/16 0859 20     Temp 03/15/16 0859 98 F (36.7 C)     Temp Source 03/15/16 0859 Oral     SpO2 03/15/16 0859 98 %     Weight 03/15/16 0859 250 lb (113.399 kg)     Height 03/15/16 0859 5\' 10"  (1.778 m)     Head Cir --      Peak Flow --      Pain Score 03/15/16 0900 5     Pain Loc --  Pain Edu? --      Excl. in GC? --     Constitutional: Alert and oriented. Well appearing and in no acute distress. Eyes: Conjunctivae are normal. PERRL. EOMI. Head: Atraumatic. Nose: No congestion/rhinnorhea. Mouth/Throat: Mucous membranes are moist.  Oropharynx non-erythematous. Neck: No stridor. Cardiovascular: Normal rate, regular rhythm. Grossly normal heart sounds.  Good peripheral circulation. Respiratory: Normal respiratory effort.  No retractions. Lungs CTAB. Gastrointestinal: Soft and nontender. No distention. No abdominal bruits. No CVA tenderness. Musculoskeletal: No lower extremity tenderness nor edema.  No joint effusions. Neurologic:  Normal speech and language. No gross focal neurologic deficits are appreciated. No gait instability. Skin:  Skin is  warm, dry and intact. No rash noted. Psychiatric: Mood and affect are normal. Speech and behavior are normal.  ____________________________________________   LABS (all labs ordered are listed, but only abnormal results are displayed)  Labs Reviewed  BASIC METABOLIC PANEL - Abnormal; Notable for the following:    Glucose, Bld 120 (*)    Creatinine, Ser 1.05 (*)    All other components within normal limits  CBC  TROPONIN I  TROPONIN I   ____________________________________________  EKG  EKG read and interpreted by me shows normal sinus rhythm rate of 69 normal axis no acute ST-T wave changes ____________________________________________  RADIOLOGY  *X-rays read by radiology as no acute disease ____________________________________________   PROCEDURES    ____________________________________________   INITIAL IMPRESSION / ASSESSMENT AND PLAN / ED COURSE  Pertinent labs & imaging results that were available during my care of the patient were reviewed by me and considered in my medical decision making (see chart for details).   ____________________________________________   FINAL CLINICAL IMPRESSION(S) / ED DIAGNOSES  Final diagnoses:  Other chest pain      NEW MEDICATIONS STARTED DURING THIS VISIT:  Discharge Medication List as of 03/15/2016 12:53 PM    START taking these medications   Details  esomeprazole (NEXIUM) 40 MG capsule Take 1 capsule (40 mg total) by mouth daily., Starting 03/15/2016, Until Thu 03/15/17, Print         Note:  This document was prepared using Dragon voice recognition software and may include unintentional dictation errors.    Arnaldo Natal, MD 03/15/16 [phone]

## 2016-03-15 NOTE — ED Notes (Signed)
Patient presents to the ED with occasional chest pain since Monday that was worse when she got up this morning.  Patient describes the pain as a heaviness/burning in the center of her chest.  Denies radiation, denies shortness of breath.  Patient's skin is warm and dry and patient is in no obvious distress at this time.

## 2017-03-01 ENCOUNTER — Other Ambulatory Visit: Payer: Self-pay | Admitting: Obstetrics & Gynecology

## 2017-03-01 DIAGNOSIS — Z1231 Encounter for screening mammogram for malignant neoplasm of breast: Secondary | ICD-10-CM

## 2017-03-23 ENCOUNTER — Ambulatory Visit
Admission: RE | Admit: 2017-03-23 | Discharge: 2017-03-23 | Disposition: A | Discharge status: Home / Self Care | Payer: BC Managed Care – PPO | Source: Ambulatory Visit | Attending: Obstetrics & Gynecology | Admitting: Obstetrics & Gynecology

## 2017-03-23 DIAGNOSIS — Z1231 Encounter for screening mammogram for malignant neoplasm of breast: Secondary | ICD-10-CM

## 2017-05-14 DIAGNOSIS — E782 Mixed hyperlipidemia: Secondary | ICD-10-CM | POA: Diagnosis not present

## 2017-05-14 DIAGNOSIS — E119 Type 2 diabetes mellitus without complications: Secondary | ICD-10-CM | POA: Diagnosis not present

## 2017-05-14 DIAGNOSIS — Z0000 Encounter for general adult medical examination without abnormal findings: Secondary | ICD-10-CM | POA: Diagnosis not present

## 2017-09-20 DIAGNOSIS — I10 Essential (primary) hypertension: Secondary | ICD-10-CM | POA: Diagnosis not present

## 2017-09-20 DIAGNOSIS — E782 Mixed hyperlipidemia: Secondary | ICD-10-CM | POA: Diagnosis not present

## 2017-09-20 DIAGNOSIS — E119 Type 2 diabetes mellitus without complications: Secondary | ICD-10-CM | POA: Diagnosis not present

## 2018-01-15 DIAGNOSIS — E782 Mixed hyperlipidemia: Secondary | ICD-10-CM | POA: Diagnosis not present

## 2018-01-15 DIAGNOSIS — I10 Essential (primary) hypertension: Secondary | ICD-10-CM | POA: Diagnosis not present

## 2018-01-15 DIAGNOSIS — E119 Type 2 diabetes mellitus without complications: Secondary | ICD-10-CM | POA: Diagnosis not present

## 2018-01-22 DIAGNOSIS — I10 Essential (primary) hypertension: Secondary | ICD-10-CM | POA: Diagnosis not present

## 2018-01-22 DIAGNOSIS — E119 Type 2 diabetes mellitus without complications: Secondary | ICD-10-CM | POA: Diagnosis not present

## 2018-01-22 DIAGNOSIS — E782 Mixed hyperlipidemia: Secondary | ICD-10-CM | POA: Diagnosis not present

## 2018-02-28 DIAGNOSIS — I10 Essential (primary) hypertension: Secondary | ICD-10-CM | POA: Diagnosis not present

## 2018-02-28 DIAGNOSIS — J069 Acute upper respiratory infection, unspecified: Secondary | ICD-10-CM | POA: Diagnosis not present

## 2018-02-28 DIAGNOSIS — E119 Type 2 diabetes mellitus without complications: Secondary | ICD-10-CM | POA: Diagnosis not present

## 2018-03-05 ENCOUNTER — Other Ambulatory Visit: Payer: Self-pay | Admitting: Obstetrics & Gynecology

## 2018-03-05 DIAGNOSIS — Z01419 Encounter for gynecological examination (general) (routine) without abnormal findings: Secondary | ICD-10-CM | POA: Diagnosis not present

## 2018-03-05 DIAGNOSIS — Z1211 Encounter for screening for malignant neoplasm of colon: Secondary | ICD-10-CM | POA: Diagnosis not present

## 2018-03-05 DIAGNOSIS — Z1231 Encounter for screening mammogram for malignant neoplasm of breast: Secondary | ICD-10-CM | POA: Diagnosis not present

## 2018-03-15 DIAGNOSIS — J209 Acute bronchitis, unspecified: Secondary | ICD-10-CM | POA: Diagnosis not present

## 2018-03-25 ENCOUNTER — Ambulatory Visit
Admission: RE | Admit: 2018-03-25 | Discharge: 2018-03-25 | Disposition: A | Discharge status: Home / Self Care | Payer: BC Managed Care – PPO | Source: Ambulatory Visit | Attending: Obstetrics & Gynecology | Admitting: Obstetrics & Gynecology

## 2018-03-25 DIAGNOSIS — Z1231 Encounter for screening mammogram for malignant neoplasm of breast: Secondary | ICD-10-CM | POA: Diagnosis present

## 2018-05-20 DIAGNOSIS — I10 Essential (primary) hypertension: Secondary | ICD-10-CM | POA: Diagnosis not present

## 2018-05-20 DIAGNOSIS — E782 Mixed hyperlipidemia: Secondary | ICD-10-CM | POA: Diagnosis not present

## 2018-05-20 DIAGNOSIS — E119 Type 2 diabetes mellitus without complications: Secondary | ICD-10-CM | POA: Diagnosis not present

## 2018-05-27 DIAGNOSIS — Z0000 Encounter for general adult medical examination without abnormal findings: Secondary | ICD-10-CM | POA: Diagnosis not present

## 2018-05-27 DIAGNOSIS — I10 Essential (primary) hypertension: Secondary | ICD-10-CM | POA: Diagnosis not present

## 2018-11-05 DIAGNOSIS — N938 Other specified abnormal uterine and vaginal bleeding: Secondary | ICD-10-CM | POA: Diagnosis not present

## 2018-11-22 DIAGNOSIS — E119 Type 2 diabetes mellitus without complications: Secondary | ICD-10-CM | POA: Diagnosis not present

## 2018-11-22 DIAGNOSIS — I10 Essential (primary) hypertension: Secondary | ICD-10-CM | POA: Diagnosis not present

## 2018-11-22 DIAGNOSIS — E782 Mixed hyperlipidemia: Secondary | ICD-10-CM | POA: Diagnosis not present

## 2018-11-29 DIAGNOSIS — E782 Mixed hyperlipidemia: Secondary | ICD-10-CM | POA: Diagnosis not present

## 2018-11-29 DIAGNOSIS — I10 Essential (primary) hypertension: Secondary | ICD-10-CM | POA: Diagnosis not present

## 2018-11-29 DIAGNOSIS — E1169 Type 2 diabetes mellitus with other specified complication: Secondary | ICD-10-CM | POA: Diagnosis not present

## 2019-01-23 DIAGNOSIS — N8501 Benign endometrial hyperplasia: Secondary | ICD-10-CM | POA: Diagnosis not present

## 2019-01-23 DIAGNOSIS — N938 Other specified abnormal uterine and vaginal bleeding: Secondary | ICD-10-CM | POA: Diagnosis not present

## 2019-03-14 ENCOUNTER — Other Ambulatory Visit: Payer: Self-pay | Admitting: Obstetrics & Gynecology

## 2019-03-14 DIAGNOSIS — Z1231 Encounter for screening mammogram for malignant neoplasm of breast: Secondary | ICD-10-CM

## 2019-04-24 ENCOUNTER — Ambulatory Visit
Admission: RE | Admit: 2019-04-24 | Discharge: 2019-04-24 | Disposition: A | Discharge status: Home / Self Care | Payer: BC Managed Care – PPO | Source: Ambulatory Visit | Attending: Obstetrics & Gynecology | Admitting: Obstetrics & Gynecology

## 2019-04-24 ENCOUNTER — Encounter (HOSPITAL_COMMUNITY): Payer: Self-pay

## 2019-04-24 DIAGNOSIS — Z1231 Encounter for screening mammogram for malignant neoplasm of breast: Secondary | ICD-10-CM | POA: Diagnosis not present

## 2020-03-11 ENCOUNTER — Other Ambulatory Visit: Payer: Self-pay | Admitting: Obstetrics & Gynecology

## 2020-03-11 DIAGNOSIS — Z1231 Encounter for screening mammogram for malignant neoplasm of breast: Secondary | ICD-10-CM

## 2020-04-27 ENCOUNTER — Ambulatory Visit
Admission: RE | Admit: 2020-04-27 | Discharge: 2020-04-27 | Disposition: A | Discharge status: Home / Self Care | Payer: BC Managed Care – PPO | Source: Ambulatory Visit | Attending: Obstetrics & Gynecology | Admitting: Obstetrics & Gynecology

## 2020-04-27 DIAGNOSIS — Z1231 Encounter for screening mammogram for malignant neoplasm of breast: Secondary | ICD-10-CM

## 2021-03-29 ENCOUNTER — Other Ambulatory Visit: Payer: Self-pay | Admitting: Family Medicine

## 2021-03-29 DIAGNOSIS — Z1231 Encounter for screening mammogram for malignant neoplasm of breast: Secondary | ICD-10-CM

## 2021-04-28 ENCOUNTER — Other Ambulatory Visit: Payer: Self-pay

## 2021-04-28 ENCOUNTER — Ambulatory Visit
Admission: RE | Admit: 2021-04-28 | Discharge: 2021-04-28 | Disposition: A | Discharge status: Home / Self Care | Payer: BC Managed Care – PPO | Source: Ambulatory Visit | Attending: Family Medicine | Admitting: Family Medicine

## 2021-04-28 DIAGNOSIS — Z1231 Encounter for screening mammogram for malignant neoplasm of breast: Secondary | ICD-10-CM | POA: Diagnosis not present

## 2022-05-03 ENCOUNTER — Other Ambulatory Visit: Payer: Self-pay | Admitting: Family Medicine

## 2022-05-03 DIAGNOSIS — Z1231 Encounter for screening mammogram for malignant neoplasm of breast: Secondary | ICD-10-CM

## 2022-05-18 ENCOUNTER — Ambulatory Visit
Admission: RE | Admit: 2022-05-18 | Discharge: 2022-05-18 | Disposition: A | Discharge status: Home / Self Care | Payer: BC Managed Care – PPO | Source: Ambulatory Visit | Attending: Family Medicine | Admitting: Family Medicine

## 2022-05-18 DIAGNOSIS — Z1231 Encounter for screening mammogram for malignant neoplasm of breast: Secondary | ICD-10-CM | POA: Insufficient documentation

## 2023-05-02 ENCOUNTER — Other Ambulatory Visit: Payer: Self-pay | Admitting: Family Medicine

## 2023-05-02 DIAGNOSIS — Z1231 Encounter for screening mammogram for malignant neoplasm of breast: Secondary | ICD-10-CM

## 2023-05-21 ENCOUNTER — Ambulatory Visit
Admission: RE | Admit: 2023-05-21 | Discharge: 2023-05-21 | Disposition: A | Discharge status: Home / Self Care | Payer: BC Managed Care – PPO | Source: Ambulatory Visit | Attending: Family Medicine | Admitting: Family Medicine

## 2023-05-21 DIAGNOSIS — Z1231 Encounter for screening mammogram for malignant neoplasm of breast: Secondary | ICD-10-CM | POA: Insufficient documentation

## 2024-05-01 ENCOUNTER — Other Ambulatory Visit: Payer: Self-pay | Admitting: Family Medicine

## 2024-05-01 DIAGNOSIS — Z1231 Encounter for screening mammogram for malignant neoplasm of breast: Secondary | ICD-10-CM

## 2024-05-21 ENCOUNTER — Ambulatory Visit
Admission: RE | Admit: 2024-05-21 | Discharge: 2024-05-21 | Disposition: A | Discharge status: Home / Self Care | Payer: Self-pay | Source: Ambulatory Visit | Attending: Family Medicine | Admitting: Family Medicine

## 2024-05-21 DIAGNOSIS — Z1231 Encounter for screening mammogram for malignant neoplasm of breast: Secondary | ICD-10-CM | POA: Insufficient documentation
